# Patient Record
Sex: Female | Born: 1960 | Marital: Married
Health system: Northeastern US, Academic
[De-identification: ages and names within clinical notes are randomized; demographics above are authoritative.]

---

## 2016-11-30 ENCOUNTER — Ambulatory Visit

## 2016-12-05 ENCOUNTER — Ambulatory Visit: Admitting: Rheumatology

## 2016-12-05 NOTE — Progress Notes (Signed)
* * *        **  Margaret [name]    --- ---    89 Y old Female, DOB: 1960-09-16    [address] [place], [name] [address]    Home: [phone]    Provider: Vania Rea        * * *    Telephone Encounter    ---    Answered by   Darrell Jewel  Date: 12/05/2016         Time: 08:49 AM    Reason   schedule lyme visit    --- ---            Message                      request from [facility] via Janace Litten to get this patient into see Dr. Leonette Most for Lyme.  We are trying to put with dr. Clarene Duke while precepted by Dr. Leonette Most.  Pt has history of Lupus and Lyme.                Action Taken   Quigan,Michael 12/05/2016 8:50:21 AM > checked w/ dr Leonette Most, lvm  for pt to call me back so I could schedule w/ dr little precepted by dr  Leonette Most. Rodriguez,Gadira 12/05/2016 9:25:12 AM > spoke with patient she is all  set for 8/20 with Dr.Jill Ruppe Quigan,Michael 12/05/2016 9:50:12 AM > scheduled by  mercedes                * * *                ---          * * *          Patient: [name], Margaret S DOB: [birth year] Provider: Vania Rea 12/05/2016    ---    Note generated by eClinicalWorks EMR/PM Software ([email or web address])

## 2016-12-17 ENCOUNTER — Ambulatory Visit: Admitting: Rheumatology

## 2016-12-17 ENCOUNTER — Ambulatory Visit (HOSPITAL_BASED_OUTPATIENT_CLINIC_OR_DEPARTMENT_OTHER): Admitting: Psychiatry

## 2016-12-17 ENCOUNTER — Ambulatory Visit: Admit: 2016-12-17 | Payer: No Typology Code available for payment source

## 2016-12-17 LAB — HX HEM-ROUTINE
HX BASO #: 0 10*3/uL (ref 0.0–0.2)
HX BASO: 0 %
HX EOSIN #: 0.1 10*3/uL (ref 0.0–0.5)
HX EOSIN: 1 %
HX HCT: 42.4 % (ref 32.0–45.0)
HX HGB: 13.6 g/dL (ref 11.0–15.0)
HX IMMATURE GRANULOCYTE#: 0 10*3/uL (ref 0.0–0.1)
HX IMMATURE GRANULOCYTE: 0 %
HX LYMPH #: 2.4 10*3/uL (ref 1.0–4.0)
HX LYMPH: 33 %
HX MCH: 27.8 pg (ref 26.0–34.0)
HX MCHC: 32.1 g/dL (ref 32.0–36.0)
HX MCV: 86.5 fL (ref 80.0–98.0)
HX MONO #: 0.6 10*3/uL (ref 0.2–0.8)
HX MONO: 8 %
HX MPV: 11.6 fL (ref 9.1–11.7)
HX NEUT #: 4.2 10*3/uL (ref 1.5–7.5)
HX PLT: 223 10*3/uL (ref 150–400)
HX RBC BLOOD COUNT: 4.9 M/uL (ref 3.70–5.00)
HX RDW: 12.6 % (ref 11.5–14.5)
HX SEG NEUT: 57 %
HX WBC: 7.4 10*3/uL (ref 4.0–11.0)

## 2016-12-17 LAB — HX BF-URINALYSIS
HX KETONES: NEGATIVE mg/dL
HX LEUKOCYTE ES: NEGATIVE
HX NITRITE LEVEL: NEGATIVE
HX SPECIFIC GRAVITY: 1.018
HX U BILIRUBIN: NEGATIVE
HX U BLOOD: NEGATIVE
HX U GLUCOSE: NEGATIVE mg/dL
HX U PH: 7
HX U PROTEIN: NEGATIVE mg/dL
HX U UROBILINIG: 0.2 EU

## 2016-12-17 LAB — HX BF-CHEM/URINE
HX CREATININE, RANDOM URINE: 106.45 mg/dL
HX PROTEIN RANDOM, URINE (INCLUDES CREATININE): 85 mg/g (ref 0–200)
HX PROTEIN, RANDOM URINE(INCLUDES CREATININE): 9 mg/dL (ref 0–15)

## 2016-12-17 LAB — HX CHEM-PANELS
HX CREATININE (CR): 0.74 mg/dL (ref 0.57–1.30)
HX GFR, AFRICAN AMERICAN: 105 mL/min/{1.73_m2}
HX GFR, NON-AFRICAN AMERICAN: 91 mL/min/{1.73_m2}

## 2016-12-17 LAB — HX CHEM-LFT
HX ALANINE AMINOTRANSFERASE (ALT/SGPT): 23 IU/L (ref 0–54)
HX ALKALINE PHOSPHATASE (ALK): 79 IU/L (ref 40–130)
HX ASPARTATE AMINOTRANFERASE (AST/SGOT): 25 IU/L (ref 10–42)
HX BILIRUBIN, TOTAL: 0.6 mg/dL (ref 0.2–1.1)

## 2016-12-17 LAB — HX HEM-MISC: HX SED RATE: 24 mm (ref 0–30)

## 2016-12-17 NOTE — Progress Notes (Signed)
.  Progress Notes  .  Patient: Margaret [name], Margaret [name]  Provider: Vania Rea    .  DOB: [birth year] Age: 56 Y Sex: Female  .  PCP: Donia Pounds  MD  Date: 12/17/2016  .  --------------------------------------------------------------------------------  .  HISTORY OF PRESENT ILLNESS  .  GENERAL:  Margaret [name] is a 56 year old woman with no  signficant past medical history who presents today with multiple  symptoms. Her symptoms began 2-3 years ago. She reports bilateral  intermittent jaw pain, left more than right worse with chewing.  She has had multiple teeth extractions and sinus surgeries with  no resolution of her pain. She also reports recurrent headaches  with bilateral temple sharp shooting pain lasting a few seconds.  She has bilateral eye floaters that were assessed by an  ophthalmologist. She reports excessive fatigue, short term memory  deficits and "brain fog".She has bilateral hand pain and  stiffness with no temporal correlation, bilateral ankle, heel and  knee pain. No morning stiffness, no swelling. She saw her PCP who  ordered Lyme studies (ab was positive but Western blot was  negative, all bands were nonreactive). She was treated with  doxycyline for 1 month (6/18-7/18) with minimal improvement of  her symptoms. She also reported microscopic hematuria of 2 years  duration with negative cystoscopy.  .  CURRENT MEDICATIONS  .  Taking Fluoxetine  .  ALLERGIES  .  yes[Allergies Verified]  .  SURGICAL HISTORY  .  Hysterectomy for fibroids  .  FAMILY HISTORY  .  Mother: alive  Mother has OA.  .  SOCIAL HISTORY  .  Lives in [place] with husband, never smoker, occasional alcohol  use, wokrs as IT consultant.  .  REVIEW OF SYSTEMS  .  Rheum :  .  Constitutional:    No recent weight loss, fevers, or chills .  Eyes:    Mild eye dryness, rest as HPI . HENT:    No hair loss,  oral ulcers . Respiratory:    No shortness of breath, or cough .  CV    No chest pain, claudication symptoms, or extremity swelling  .  Gastrointestinal    No diarrhea, or bloody stool .  Genitourinary    No dysuria, or discharges . Musculoskeletal     See HPI.  .  .  PHYSICAL EXAMINATION  .  General Examination:  General Appearance  well developed, well nourished, alert,  oriented, in no acute distress. Eyes  sclera non-icteric. Oral  Cavity  mucosa moist. Neck/Thyroid  carotid pulse normal, no  carotid bruit, no jugular venous distension. Skin  no rashes.  Heart  regular rate and rhythm, normal S1, normal S2, faint II/VI  SEM over RUSB. Lungs  clear to auscultation bilaterally with no  wheezing, no crackles, no rhonchi. Abdomen  soft, non tender, non  distended, bowel sounds present. Extremities  Heberden nodes in  both thumbs, all other hands joints are within normal limits. No  swelling or tenderness. ROM intact in elbow, shoulder, hip and  knees. Tenderness over right post tibialis tendon, no other  abnormalites.  .  ASSESSMENTS  .  Pain, joint, multiple sites - M25.50 (Primary)  .  Attending Leonette Most) - I have reviewed full history and confirmed  key elements as well as examined her. We do not see clinical  evidence for Lyme and completely negative w blot indicates she  has not had this infection. Given pos ANA hematuria and array of  nonspecific  symptoms feel is reasonable to perform testing for  connective tissue disease. Her main concern is her cognitive  symptoms - discussed this is nonspecific and can have many  causes, did broach possibility of a fibromyalgia tendency as the  fatigue musculoskeletal pain out of propostion to physical  findings and cognitive symptoms all could fit though she does not  have the tender points. In addition feel many symptoms both  musculoskeletal and otherwise may be C/W common regional issues  and other processes common to her age, for example suspect early  hand OA, possible disc disease back, floaters in eyes etc.  .  TREATMENT  .  Pain, joint, multiple sites  LAB: Renal Function(80069)  .  LAB:  Protein/Creatinine, Random Urine  Protein, Random Urine (includes Creatinine)     9     (0 - 15 -  mg/dL)  Creatinine Random, Urine     106.45     ( - mg/dL)  Protein/Creatinine Ratio, Urine     85     (0 - 200 - mg/g)  .  Marland Kitchen  LAB: Sed Rate ESR (ESR)  Sed Rate     24     (0 - 30 - mm)  .  Marland Kitchen  LAB: Urinalysis UA (UA)  U KETONES     Negative     (Negative - mg/dL)  U Bilirubin     Negative     (Negative - )  U Glucose     Negative     (Negative - mg/dL)  U Color     Yellow     ( - )  U Appearance     Clear     ( - )  U pH     7     (5.0-8.0 - )  Specific Gravity     1.018     (1.001-1.035 - )  U Blood     Negative     (Negative - )  U Protein     Negative     (Negative - mg/dL)  U Urobilinig     0.2     (0.2-1.0 - EU)  Leukocyte Es     Negative     (Negative - )  Nitrite Level     Negative     (Negative - )  .  Marland Kitchen  LAB: Anti DS DNA (ADNA)  Anti DS DNA     2.1     (0.0 - 25.0 - EU)  .  Marland Kitchen  LAB: Anti Nuclear Ab Screen  .  LAB: Ext Nclr Ag (Ena) Smith  Ext Nclr Ag (Ena) Smith     1.1     (0.0 - 20.0 - EU)  .  Marland Kitchen  LAB: Ext Nclr Antg(Ena) RNP  Ext Nclr Antg(Ena) RNP     1.0     (0.0 - 20.0 - EU)  .  Marland Kitchen  LAB: Ext Nclr Antg(Ena) SSA  Ext Nclr Antg(Ena) SSA     1.5     (0.0 - 20.0 - EU)  .  Marland Kitchen  LAB: Ext Nclr Antg(Ena) SSB  Ext Nclr Antg(Ena) SSB     <1.0     (0.0 - 20.0 - EU)  .  Marland Kitchen  LAB: C-Reactive Protein, High sens (CRPHS)  .  LAB: CBC/DIFF with PLT (CBCWD)  WBC     7.4     (4.0 - 11.0 - K/uL)  RBC     4.90     (3.70 -  5.00 - M/uL)  HGB     13.6     (11.0 - 15.0 - g/dL)  HCT     09.8     (11.9 - 45.0 - %)  MCV     86.5     (80.0 - 98.0 - fL)  MCH     27.8     (26.0 - 34.0 - pg)  MCHC     32.1     (32.0 - 36.0 - g/dL)  RDW     14.7     (82.9 - 14.5 - %)  PLT     223     (150 - 400 - K/uL)  MPV     11.6     (9.1 - 11.7 - fL)  SEG NEUT     57     ( - %)  LYMPH     33     ( - %)  MONO     8     ( - %)  EOS     1     ( - %)  BASO     0     ( - %)  NEUT #     4.2     (1.5 - 7.5 - K/uL)  LYMPH #     2.4     (1.0 - 4.0 - K/uL)  MONO #      0.6     (0.2 - 0.8 - K/uL)  EOSIN #     0.1     (0.0 - 0.5 - K/uL)  BASO #     0.0     (0.0 - 0.2 - K/uL)  Imm Grnas     0     ( - %)  Imm Grans, Abs     0.0     (0.0 - 0.1 - K/uL)  .  Marland Kitchen  LAB: Creatinine (CR)  Creatinine (CR)     0.74     (0.57 - 1.30 - mg/dL)  .  FOLLOW UP  .  prn  .  Electronically signed by Vania Rea , MD on  12/21/2016 at 05:52 PM EDT  .  ADDENDUM:  12/21/2016 05:54 PM KALISH, ROBERT > ANA 1:640 but with all other lab tests here negative do not feel this is enough to indicate lupus or connective tissue disease. Discussed with her and advised she discuss with Dr Evalyn Casco at her next visit in September. Only if there are new indications of signs or symptoms to suggest lupus would I recommend retesting of ANA and other serologies. Sjogren's is a slight possibility with the ANA and some symptoms of dryness even with negative SSA and SSB but even if it is that do not now see systemic features that would indicate the need for more than local measures and symptomatic treatment  .  Document electronically signed by Vania Rea    .

## 2016-12-17 NOTE — Progress Notes (Signed)
* * *        **Margaret [name]**    --- ---    5 Y old Female, DOB: [birth year], External MRN: [id number]    Account Number: [id number]    [address], [place], [place]    Home: [phone]    Insurance: [employer] CIP Payer ID: PAPER    PCP: Donia Pounds, MD Referring: Donia Pounds, MD External Visit ID:  [id number]    Appointment Facility: [facility]ology, All[facility]ology        * * *    12/17/2016  Vania Rea, MD **CHN#:** [id number]    --- ---    ---        Current Medications    ---    Taking     * Fluoxetine     ---      Surgical History    ---      Hysterectomy for fibroids    ---      Family History    ---      Mother: alive    ---    Mother has OA.    ---      Social History    ---      Lives in [place] with husband, never smoker, occasional alcohol use, wokrs  as IT consultant.    ---      Review of Systems    ---     _Rheum_ :    Constitutional: No recent weight loss, fevers, or chills. Eyes: Mild eye  dryness, rest as HPI. HENT: No hair loss, oral ulcers. Respiratory: No  shortness of breath, or cough. CV No chest pain, claudication symptoms, or  extremity swelling. Gastrointestinal No diarrhea, or bloody stool.  Genitourinary No dysuria, or discharges. Musculoskeletal See HPI. Marland Kitchen            History of Present Illness    ---     _GENERAL_ :    Margaret [name] is a 56 year old woman with no signficant past medical history who  presents today with multiple symptoms.    Her symptoms began 2-3 years ago. She reports bilateral intermittent jaw pain,  left more than right worse with chewing. She has had multiple teeth  extractions and sinus surgeries with no resolution of her pain. She also  reports recurrent headaches with bilateral temple sharp shooting pain lasting  a few seconds. She has bilateral eye floaters that were assessed by an  ophthalmologist.    She reports excessive fatigue, short term memory deficits and "brain fog".    She has bilateral hand pain and stiffness with no temporal  correlation,  bilateral ankle, heel and knee pain. No morning stiffness, no swelling.    She saw her PCP who ordered Lyme studies (ab was positive but Western blot was  negative, all bands were nonreactive). She was treated with doxycyline for 1  month (6/18-7/18) with minimal improvement of her symptoms.    She also reported microscopic hematuria of 2 years duration with negative  cystoscopy.      Physical Examination    ---     _General Examination_ :    General Appearance well developed, well nourished, alert, oriented, in no  acute distress. Eyes sclera non-icteric. Oral Cavity mucosa moist.  Neck/Thyroid carotid pulse normal, no carotid bruit, no jugular venous  distension. Skin no rashes. Heart regular rate and rhythm, normal S1, normal  S2, faint II/VI SEM over RUSB. Lungs clear to auscultation bilaterally with no  wheezing, no crackles, no rhonchi. Abdomen soft, non tender, non distended,  bowel sounds present. Extremities Heberden nodes in both thumbs, all other  hands joints are within normal limits. No swelling or tenderness. ROM intact  in elbow, shoulder, hip and knees. Tenderness over right post tibialis tendon,  no other abnormalites.          Assessments    ---    1\. Pain, joint, multiple sites - M25.50 (Primary)    ---      Attending Leonette Most) - I have reviewed full history and confirmed key elements  as well as examined her. We do not see clinical evidence for Lyme and  completely negative w blot indicates she has not had this infection. Given pos  ANA hematuria and array of nonspecific symptoms feel is reasonable to perform  testing for connective tissue disease. Her main concern is her cognitive  symptoms - discussed this is nonspecific and can have many causes, did broach  possibility of a fibromyalgia tendency as the fatigue musculoskeletal pain out  of propostion to physical findings and cognitive symptoms all could fit though  she does not have the tender points. In addition feel many  symptoms both  musculoskeletal and otherwise may be C/W common regional issues and other  processes common to her age, for example suspect early hand OA, possible disc  disease back, floaters in eyes etc.    ---      Treatment    ---       **1\. Pain, joint, multiple sites**    _LAB: Renal Function(80069)_    _LAB: Protein/Creatinine, Random Urine_   Protein, Random Urine (includes  Creatinine)  9    0 - 15 - mg/dL    --- --- --- ---    Creatinine Random, Urine  106.45     \- mg/dL    --- --- --- ---    Protein/Creatinine Ratio, Urine  85    0 - 200 - mg/g    --- --- --- ---    _LAB: Sed Rate ESR (ESR)_ Sed Rate  24    0 - 30 - mm    --- --- --- ---    _LAB: Urinalysis UA (UA)_ U KETONES  Negative    Negative - mg/dL    --- --- --- ---    U Bilirubin  Negative    Negative -    --- --- --- ---    U Glucose  Negative    Negative - mg/dL    --- --- --- ---    U Color  Yellow     \-    --- --- --- ---    U Appearance  Clear     \-    --- --- --- ---    U pH  7    5.0-8.0 -    --- --- --- ---    Specific Gravity  1.018    1.001-1.035 -    --- --- --- ---    U Blood  Negative    Negative -    --- --- --- ---    U Protein  Negative    Negative - mg/dL    --- --- --- ---    U Urobilinig  0.2    0.2-1.0 - EU    --- --- --- ---    Leukocyte Es  Negative    Negative -    --- --- --- ---    Nitrite Level  Negative  Negative -    --- --- --- ---    _LAB: Anti DS DNA (ADNA)_ Anti DS DNA  2.1    0.0 - 25.0 - EU    --- --- --- ---    _LAB: Anti Nuclear Ab Screen_    _LAB: Ext Nclr Ag (Ena) Smith_ Ext Nclr Ag (Ena) Smith  1.1    0.0 - 20.0 - EU    --- --- --- ---    _LAB: Ext Nclr Antg(Ena) RNP_ Ext Nclr Antg(Ena) RNP  1.0    0.0 - 20.0 - EU    --- --- --- ---    _LAB: Ext Nclr Antg(Ena) SSA_ Ext Nclr Antg(Ena) SSA  1.5    0.0 - 20.0 - EU    --- --- --- ---    _LAB: Ext Nclr Antg(Ena) SSB_ Ext Nclr Antg(Ena) SSB  <1.0    0.0 - 20.0 - EU    --- --- --- ---    _LAB: C-Reactive Protein, High sens (CRPHS)_    _LAB: CBC/DIFF with PLT  (CBCWD)_ WBC  7.4    4.0 - 11.0 - K/uL    --- --- --- ---    RBC  4.90    3.70 - 5.00 - M/uL    --- --- --- ---    HGB  13.6    11.0 - 15.0 - g/dL    --- --- --- ---    HCT  42.4    32.0 - 45.0 - %    --- --- --- ---    MCV  86.5    80.0 - 98.0 - fL    --- --- --- ---    MCH  27.8    26.0 - 34.0 - pg    --- --- --- ---    MCHC  32.1    32.0 - 36.0 - g/dL    --- --- --- ---    RDW  12.6    11.5 - 14.5 - %    --- --- --- ---    PLT  223    150 - 400 - K/uL    --- --- --- ---    MPV  11.6    9.1 - 11.7 - fL    --- --- --- ---    SEG NEUT  57     \- %    --- --- --- ---    LYMPH  33     \- %    --- --- --- ---    MONO  8     \- %    --- --- --- ---    EOS  1     \- %    --- --- --- ---    BASO  0     \- %    --- --- --- ---    NEUT #  4.2    1.5 - 7.5 - K/uL    --- --- --- ---    LYMPH #  2.4    1.0 - 4.0 - K/uL    --- --- --- ---    MONO #  0.6    0.2 - 0.8 - K/uL    --- --- --- ---    EOSIN #  0.1    0.0 - 0.5 - K/uL    --- --- --- ---    BASO #  0.0    0.0 - 0.2 - K/uL    --- --- --- ---    Imm Adria Dill  0     \- %    --- --- --- ---    Imm Grans, Abs  0.0    0.0 - 0.1 - K/uL    --- --- --- ---    _LAB: Creatinine (CR)_ Creatinine (CR)  0.74    0.57 - 1.30 - mg/dL    --- --- --- ---      Follow Up    ---    prn    Electronically signed by Vania Rea , MD on 12/21/2016 at 05:52 PM EDT    Sign off status: Completed        * * *        Rheumatology, Allergy and Immunology    [address]    [place], [name] [address]    Tel: [phone]    Fax: [phone]              * * *          Patient: Margaret [name], Margaret [name] DOB: Jul 28, 1960 Progress Note: Vania Rea, MD  12/17/2016    ---    Note generated by eClinicalWorks EMR/PM Software ([email or web address])

## 2016-12-21 ENCOUNTER — Ambulatory Visit: Admitting: Rheumatology

## 2016-12-21 LAB — HX IMMUNOLOGY
HX ANA TITER: 1:640 {titer} — AB
HX ANTI DS DNA: 2.1 EU (ref 0.0–25.0)
HX ANTI NUCLEAR ANTIBODY SCREEN: REACTIVE — AB
HX C-REACTIVE PROTEIN (HIGH SENSITIVITY): 1.98 mg/L (ref 0.00–7.48)
HX EXT NCLR AG (ENA) SMITH: 1.1 EU (ref 0.0–20.0)
HX EXT NCLR ANTG(ENA) RNP: 1 EU (ref 0.0–20.0)
HX EXT NCLR ANTG(ENA) SSA: 1.5 EU (ref 0.0–20.0)
HX EXT NCLR ANTG(ENA) SSB: <1 EU (ref 0.0–20.0)

## 2016-12-21 NOTE — Progress Notes (Signed)
* * *        **  Margaret [name]    --- ---    64 Y old Female, DOB: [birth year]    [address] [place], [name] [address]    Home: [phone]    Provider: Vania Rea        * * *    Telephone Encounter    ---    Answered by   Elvina Sidle A  Date: 12/21/2016         Time: 02:38 PM    Reason   Labs results    --- ---            Message                      Patient calling for lab results. Call back number on file.                Action Taken   KUMAR,SONAM A 12/21/2016 2:38:23 PM > Reviewed labs with her see  addendum to note Annell Canty 12/21/2016 5:55:00 PM >                * * *                ---          * * *          Patient: [name], Margaret S DOB: 12-09-1960 Provider: Vania Rea 12/21/2016    ---    Note generated by eClinicalWorks EMR/PM Software ([email or web address])

## 2019-09-10 ENCOUNTER — Ambulatory Visit: Admitting: Infectious Disease

## 2019-09-10 ENCOUNTER — Ambulatory Visit: Admit: 2019-09-10 | Payer: No Typology Code available for payment source

## 2019-10-01 ENCOUNTER — Ambulatory Visit: Admitting: Infectious Disease

## 2019-10-01 ENCOUNTER — Ambulatory Visit: Admit: 2019-10-01 | Payer: No Typology Code available for payment source

## 2019-10-13 IMAGING — MR RM - COLUNA LOMBAR
4 of 5 series · 19 of 48 positions shown · non-contrast
Comparison: none

[Series 3: T2 · sagittal · 4.0mm · 0.61mm/px · 9 of 13 slices shown (1 of 2)]
[im 1/13]
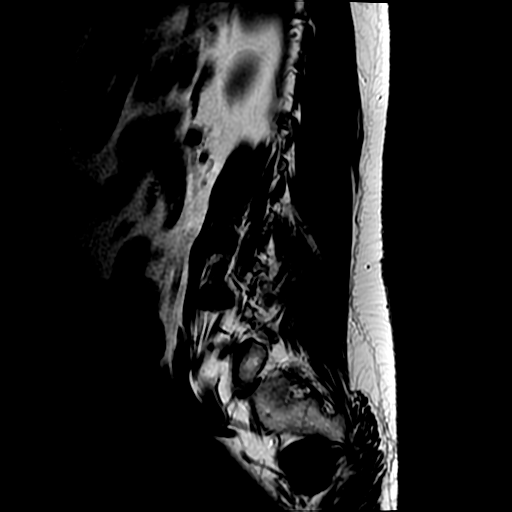
[im 2/13]
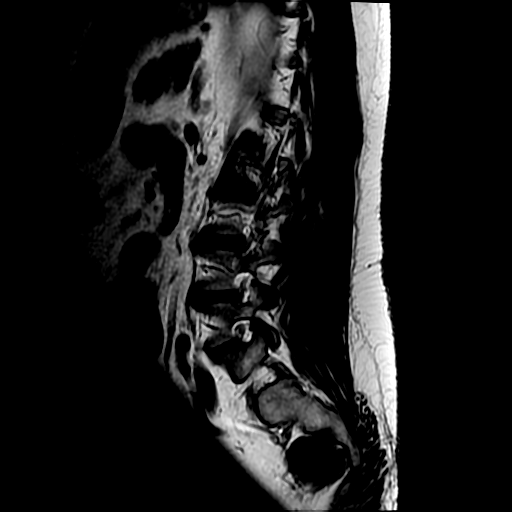
[im 4/13]
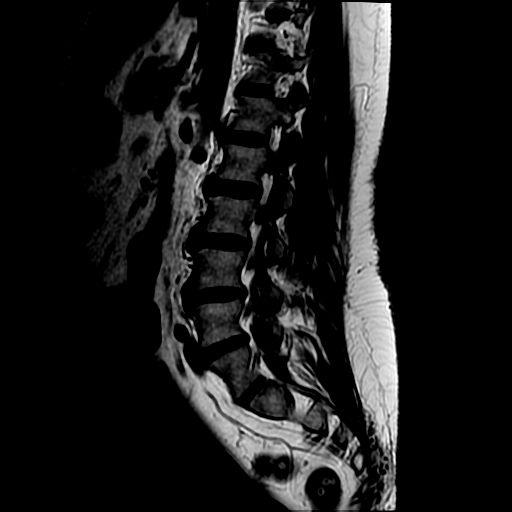
[im 5/13]
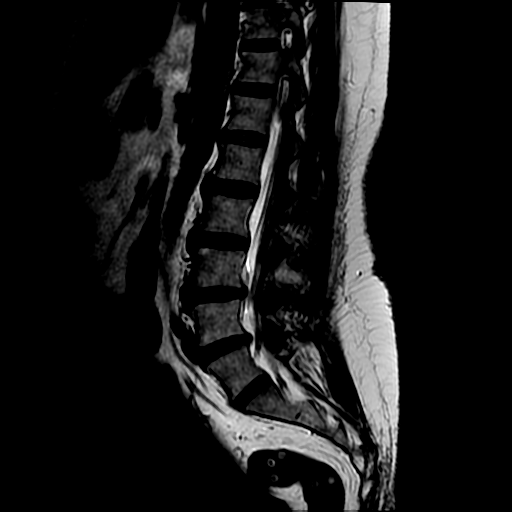
[im 7/13]
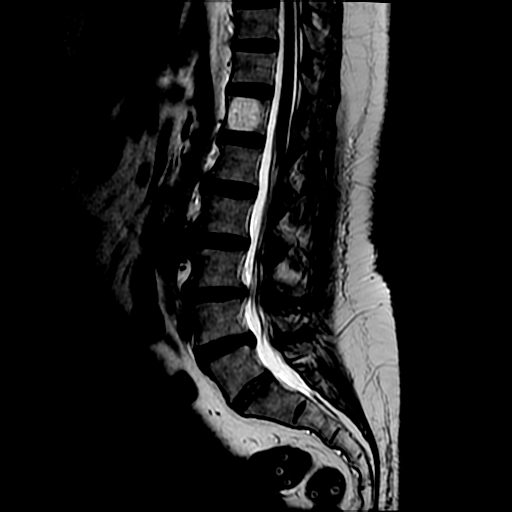
[im 8/13]
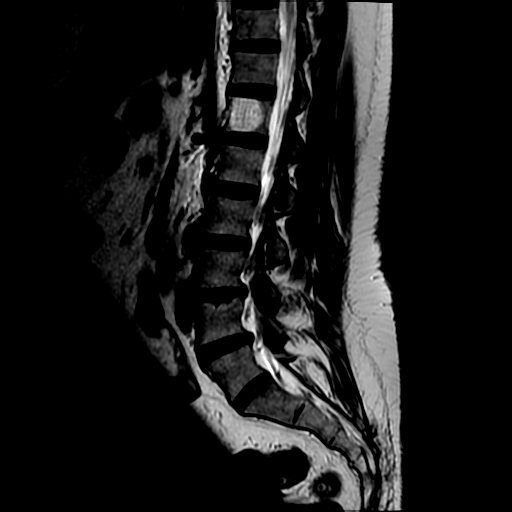
[im 10/13]
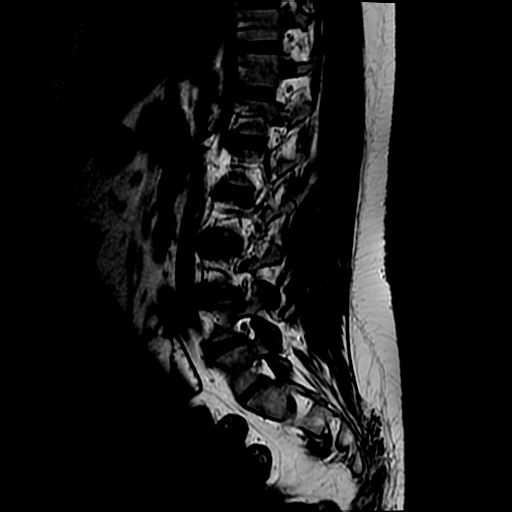
[im 11/13]
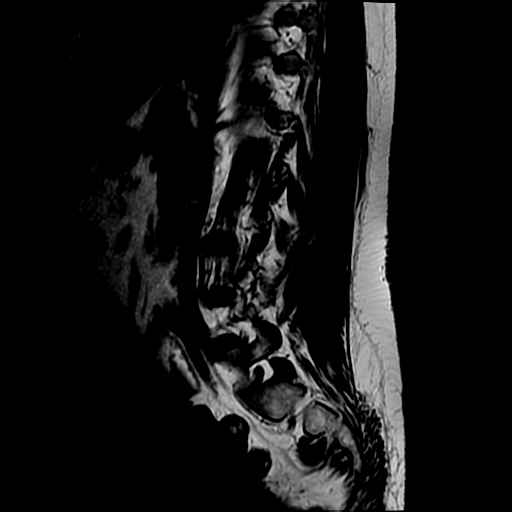
[im 13/13]
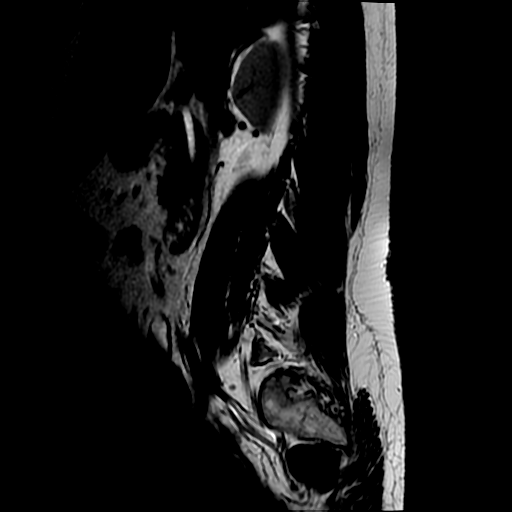

[Series 4: T1 · sagittal · 4.0mm · 0.61mm/px · 3 of 13 slices shown]
[im 2/13]
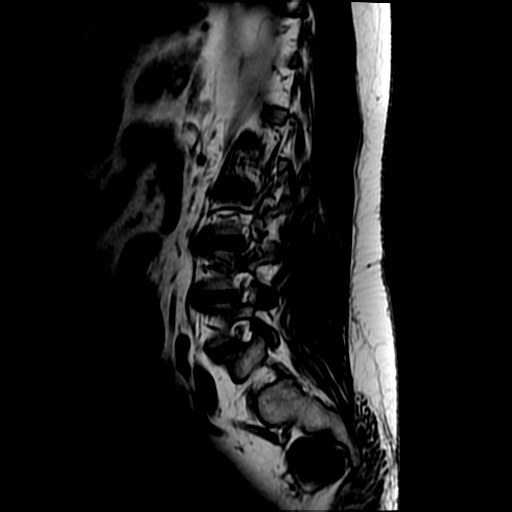
[im 7/13]
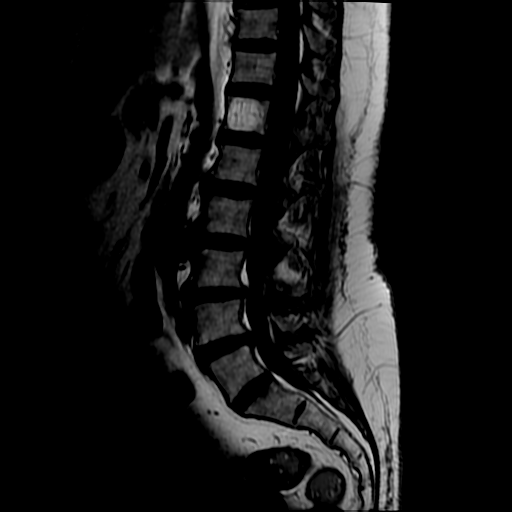
[im 11/13]
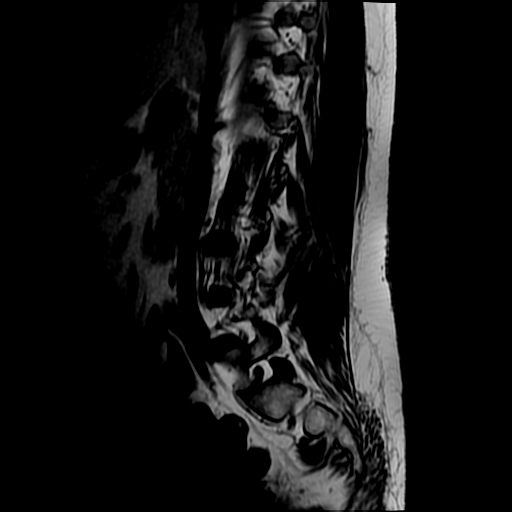

[Series 5: STIR · sagittal · 4.0mm · 0.61mm/px · 3 of 13 slices shown]
[im 2/13]
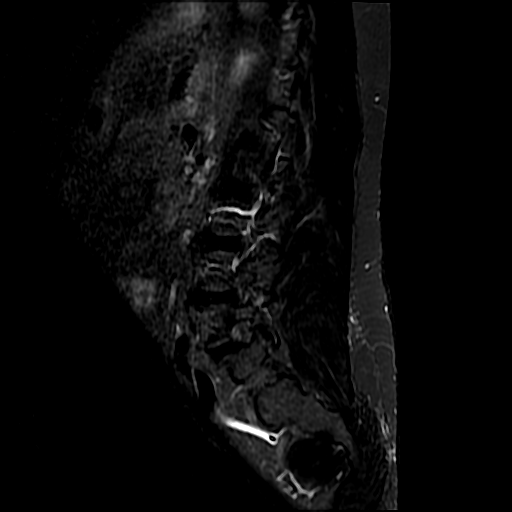
[im 7/13]
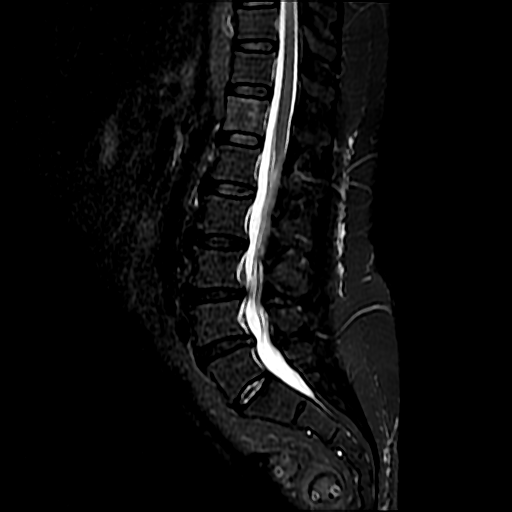
[im 11/13]
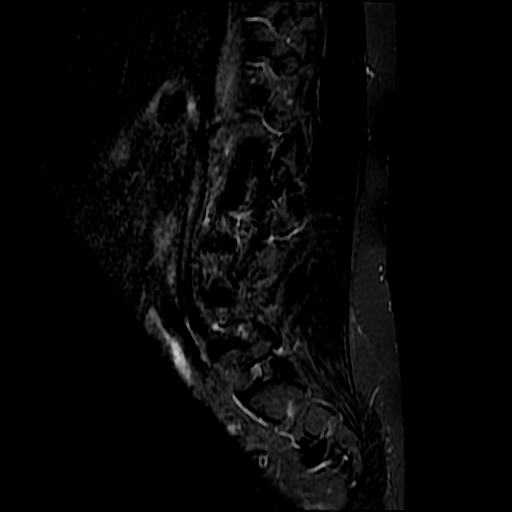

[Series 6: T2 · axial · 4.0mm · 0.39mm/px · z∈[-141,+16]mm · 4 of 24 slices shown (2 of 2)]
[im 1/24]
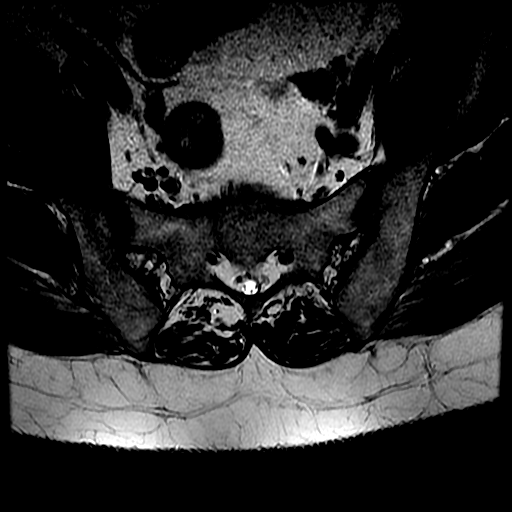
[im 4/24]
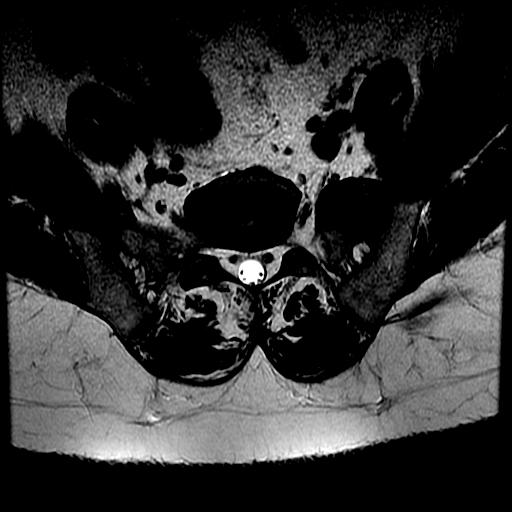
[im 12/24]
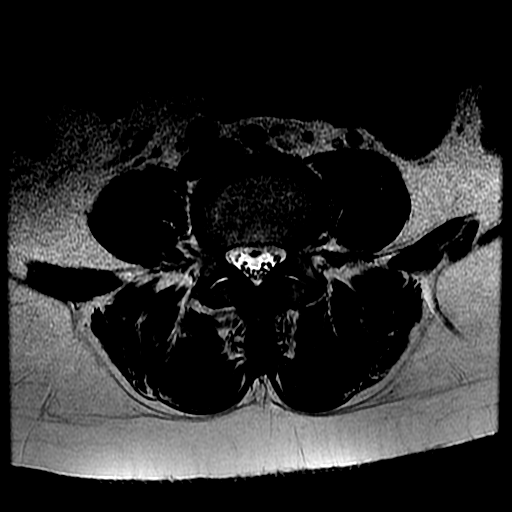
[im 20/24]
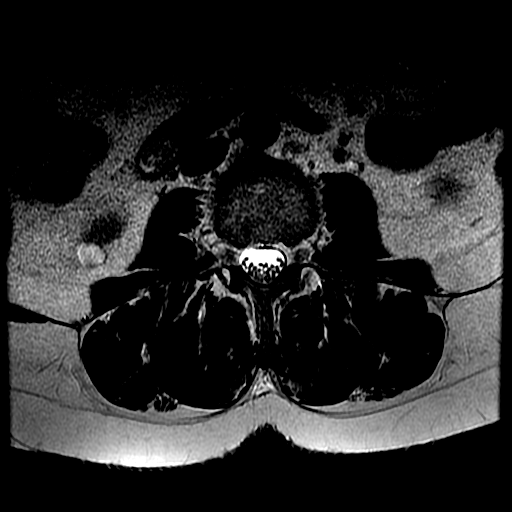

[19 of 48 positions shown; findings below may reference images not displayed]

TÉCNICA:
Realizadas sequências FSE e STIR, ponderadas em T1 e T2, em aquisições multiplanares, sem contraste.

ANÁLISE:
RESSONÂNCIA MAGNÉTICA DA COLUNA LOMBOSSACRA 
Bom alinhamento dos corpos vertebrais examinados em decúbito. 
Lombarização de S1, com disco rudimentar interposto em S1-S2.
Osteófitos marginais nos corpos vertebrais lombares.
Alteração degenerativa Modic tipo I / II nos planaltos vertebrais L4 e L5.
Imagem sugestiva de hemangioma no corpo vertebral L1.
Alterações degenerativas nas articulações interapofisárias L5-S1.
Desidratação dos discos intervertebrais lombares.
Nível L3-L4: discreto abaulamento simétrico, sem compressão radicular.
Nível L4-L5: abaulamento assimétrico posterior centrolateral direito que comprime as raízes descendentes 
neste nível, sobretudo a direita.
Nível L5-S1: protrusão posterior centrolateral direita que toca a raiz descendente direita.
Canal vertebral e forames intervertebrais com amplitudes preservadas.
Cone medular com topografia e sinal normais.
Saco dural e raízes nervosas intra e extradurais com trajeto e sinal conservados.
Partes moles paravertebrais com aspecto normal.
  Se  você  não  for  destinatário,  saiba  que  qualquer  divulgação,  cópia,  distribuição  ou  utilização  do  conteúdo  dessas 
informações é proibido e passível de punição dentro da lei.
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
## 2020-07-26 NOTE — Progress Notes (Signed)
* * *        **  Margaret [name]    --- ---    89 Y old Female, DOB: 1960-09-16    [address] [place], [name] [address]    Home: [phone]    Provider: Vania Rea        * * *    Telephone Encounter    ---    Answered by   Darrell Jewel  Date: 12/05/2016         Time: 08:49 AM    Reason   schedule lyme visit    --- ---            Message                      request from [facility] via Janace Litten to get this patient into see Dr. Leonette Most for Lyme.  We are trying to put with dr. Clarene Duke while precepted by Dr. Leonette Most.  Pt has history of Lupus and Lyme.                Action Taken   Quigan,Michael 12/05/2016 8:50:21 AM > checked w/ dr Leonette Most, lvm  for pt to call me back so I could schedule w/ dr little precepted by dr  Leonette Most. Rodriguez,Gadira 12/05/2016 9:25:12 AM > spoke with patient she is all  set for 8/20 with Dr.Jill Ruppe Quigan,Michael 12/05/2016 9:50:12 AM > scheduled by  mercedes                * * *                ---          * * *          Patient: [name], Margaret S DOB: [birth year] Provider: Vania Rea 12/05/2016    ---    Note generated by eClinicalWorks EMR/PM Software ([email or web address])

## 2020-07-26 NOTE — Progress Notes (Signed)
* * *        **  Margaret [name]    --- ---    64 Y old Female, DOB: [birth year]    [address] [place], [name] [address]    Home: [phone]    Provider: Vania Rea        * * *    Telephone Encounter    ---    Answered by   Elvina Sidle A  Date: 12/21/2016         Time: 02:38 PM    Reason   Labs results    --- ---            Message                      Patient calling for lab results. Call back number on file.                Action Taken   KUMAR,SONAM A 12/21/2016 2:38:23 PM > Reviewed labs with her see  addendum to note Annell Canty 12/21/2016 5:55:00 PM >                * * *                ---          * * *          Patient: [name], Margaret S DOB: 12-09-1960 Provider: Vania Rea 12/21/2016    ---    Note generated by eClinicalWorks EMR/PM Software ([email or web address])

## 2020-07-26 NOTE — Progress Notes (Signed)
* * *        **Tomasa Rand**    --- ---    60 Y old Female, DOB: [birth year], External MRN: [id number]    Account Number: [id number]    [address], [place], [place]    Home: [phone]    Insurance: [employer] CIP Payer ID: PAPER    PCP: Donia Pounds, MD Referring: Donia Pounds, MD External Visit ID:  [id number]    Appointment Facility: [facility]ology[facility] Immunology        * * *    12/17/2016  Vania Rea, MD **CHN#:** [id number]    --- ---    ---        Current Medications    ---    Taking     * Fluoxetine     ---      Surgical History    ---      Hysterectomy for fibroids    ---      Family History    ---      Mother: alive    ---    Mother has OA.    ---      Social History    ---      Lives in [place] with husband, never smoker, occasional alcohol use, wokrs  as IT consultant.    ---      Review of Systems    ---     _Rheum_ :    Constitutional: No recent weight loss, fevers, or chills. Eyes: Mild eye  dryness, rest as HPI. HENT: No hair loss, oral ulcers. Respiratory: No  shortness of breath, or cough. CV No chest pain, claudication symptoms, or  extremity swelling. Gastrointestinal No diarrhea, or bloody stool.  Genitourinary No dysuria, or discharges. Musculoskeletal See HPI. Marland Kitchen            History of Present Illness    ---     _GENERAL_ :    Ms. Elbert is a 60 year old woman with no signficant past medical history who  presents today with multiple symptoms.    Her symptoms began 2-3 years ago. She reports bilateral intermittent jaw pain,  left more than right worse with chewing. She has had multiple teeth  extractions and sinus surgeries with no resolution of her pain. She also  reports recurrent headaches with bilateral temple sharp shooting pain lasting  a few seconds. She has bilateral eye floaters that were assessed by an  ophthalmologist.    She reports excessive fatigue, short term memory deficits and "brain fog".    She has bilateral hand pain and stiffness with no temporal  correlation,  bilateral ankle, heel and knee pain. No morning stiffness, no swelling.    She saw her PCP who ordered Lyme studies (ab was positive but Western blot was  negative, all bands were nonreactive). She was treated with doxycyline for 1  month (6/18-7/18) with minimal improvement of her symptoms.    She also reported microscopic hematuria of 2 years duration with negative  cystoscopy.      Physical Examination    ---     _General Examination_ :    General Appearance well developed, well nourished, alert, oriented, in no  acute distress. Eyes sclera non-icteric. Oral Cavity mucosa moist.  Neck/Thyroid carotid pulse normal, no carotid bruit, no jugular venous  distension. Skin no rashes. Heart regular rate and rhythm, normal S1, normal  S2, faint II/VI SEM over RUSB. Lungs clear to auscultation bilaterally with no  wheezing, no crackles, no rhonchi. Abdomen soft, non tender, non distended,  bowel sounds present. Extremities Heberden nodes in both thumbs, all other  hands joints are within normal limits. No swelling or tenderness. ROM intact  in elbow, shoulder, hip and knees. Tenderness over right post tibialis tendon,  no other abnormalites.          Assessments    ---    1\. Pain, joint, multiple sites - M25.50 (Primary)    ---      Attending Leonette Most) - I have reviewed full history and confirmed key elements  as well as examined her. We do not see clinical evidence for Lyme and  completely negative w blot indicates she has not had this infection. Given pos  ANA hematuria and array of nonspecific symptoms feel is reasonable to perform  testing for connective tissue disease. Her main concern is her cognitive  symptoms - discussed this is nonspecific and can have many causes, did broach  possibility of a fibromyalgia tendency as the fatigue musculoskeletal pain out  of propostion to physical findings and cognitive symptoms all could fit though  she does not have the tender points. In addition feel many  symptoms both  musculoskeletal and otherwise may be C/W common regional issues and other  processes common to her age, for example suspect early hand OA, possible disc  disease back, floaters in eyes etc.    ---      Treatment    ---       **1\. Pain, joint, multiple sites**    _LAB: Renal Function(80069)_    _LAB: Protein/Creatinine, Random Urine_   Protein, Random Urine (includes  Creatinine)  9    0 - 15 - mg/dL    --- --- --- ---    Creatinine Random, Urine  106.45     \- mg/dL    --- --- --- ---    Protein/Creatinine Ratio, Urine  85    0 - 200 - mg/g    --- --- --- ---    _LAB: Sed Rate ESR (ESR)_ Sed Rate  24    0 - 30 - mm    --- --- --- ---    _LAB: Urinalysis UA (UA)_ U KETONES  Negative    Negative - mg/dL    --- --- --- ---    U Bilirubin  Negative    Negative -    --- --- --- ---    U Glucose  Negative    Negative - mg/dL    --- --- --- ---    U Color  Yellow     \-    --- --- --- ---    U Appearance  Clear     \-    --- --- --- ---    U pH  7    5.0-8.0 -    --- --- --- ---    Specific Gravity  1.018    1.001-1.035 -    --- --- --- ---    U Blood  Negative    Negative -    --- --- --- ---    U Protein  Negative    Negative - mg/dL    --- --- --- ---    U Urobilinig  0.2    0.2-1.0 - EU    --- --- --- ---    Leukocyte Es  Negative    Negative -    --- --- --- ---    Nitrite Level  Negative  Negative -    --- --- --- ---    _LAB: Anti DS DNA (ADNA)_ Anti DS DNA  2.1    0.0 - 25.0 - EU    --- --- --- ---    _LAB: Anti Nuclear Ab Screen_    _LAB: Ext Nclr Ag (Ena) Smith_ Ext Nclr Ag (Ena) Smith  1.1    0.0 - 20.0 - EU    --- --- --- ---    _LAB: Ext Nclr Antg(Ena) RNP_ Ext Nclr Antg(Ena) RNP  1.0    0.0 - 20.0 - EU    --- --- --- ---    _LAB: Ext Nclr Antg(Ena) SSA_ Ext Nclr Antg(Ena) SSA  1.5    0.0 - 20.0 - EU    --- --- --- ---    _LAB: Ext Nclr Antg(Ena) SSB_ Ext Nclr Antg(Ena) SSB  <1.0    0.0 - 20.0 - EU    --- --- --- ---    _LAB: C-Reactive Protein, High sens (CRPHS)_    _LAB: CBC/DIFF with PLT  (CBCWD)_ WBC  7.4    4.0 - 11.0 - K/uL    --- --- --- ---    RBC  4.90    3.70 - 5.00 - M/uL    --- --- --- ---    HGB  13.6    11.0 - 15.0 - g/dL    --- --- --- ---    HCT  42.4    32.0 - 45.0 - %    --- --- --- ---    MCV  86.5    80.0 - 98.0 - fL    --- --- --- ---    MCH  27.8    26.0 - 34.0 - pg    --- --- --- ---    MCHC  32.1    32.0 - 36.0 - g/dL    --- --- --- ---    RDW  12.6    11.5 - 14.5 - %    --- --- --- ---    PLT  223    150 - 400 - K/uL    --- --- --- ---    MPV  11.6    9.1 - 11.7 - fL    --- --- --- ---    SEG NEUT  57     \- %    --- --- --- ---    LYMPH  33     \- %    --- --- --- ---    MONO  8     \- %    --- --- --- ---    EOS  1     \- %    --- --- --- ---    BASO  0     \- %    --- --- --- ---    NEUT #  4.2    1.5 - 7.5 - K/uL    --- --- --- ---    LYMPH #  2.4    1.0 - 4.0 - K/uL    --- --- --- ---    MONO #  0.6    0.2 - 0.8 - K/uL    --- --- --- ---    EOSIN #  0.1    0.0 - 0.5 - K/uL    --- --- --- ---    BASO #  0.0    0.0 - 0.2 - K/uL    --- --- --- ---    Imm Adria Dill  0     \- %    --- --- --- ---    Imm Grans, Abs  0.0    0.0 - 0.1 - K/uL    --- --- --- ---    _LAB: Creatinine (CR)_ Creatinine (CR)  0.74    0.57 - 1.30 - mg/dL    --- --- --- ---      Follow Up    ---    prn    Electronically signed by Vania Rea , MD on 12/21/2016 at 05:52 PM EDT    Sign off status: Completed        * * *        Rheumatology, Allergy [facility] Immunology    [address]    [place], [name] [address]    Tel: [phone]    Fax: [phone]              * * *          Patient: LAVENDER, STANKE DOB: Jul 28, 1960 Progress Note: Vania Rea, MD  12/17/2016    ---    Note generated by eClinicalWorks EMR/PM Software ([email or web address])

## 2020-09-06 IMAGING — MR RM - CRANIO (ENCEFALO)
10 of 14 series · 18 of 48 positions shown · non-contrast
Comparison: none

[Series 3: FLAIR · sagittal · 5.0mm · 0.49mm/px · 2 of 24 slices shown (1 of 2)]
[im 1/24]
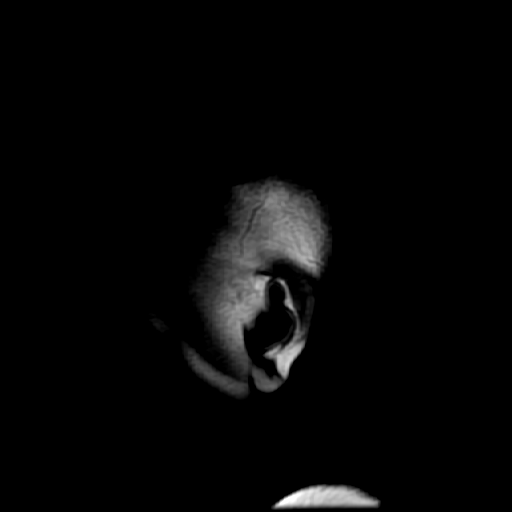
[im 24/24]
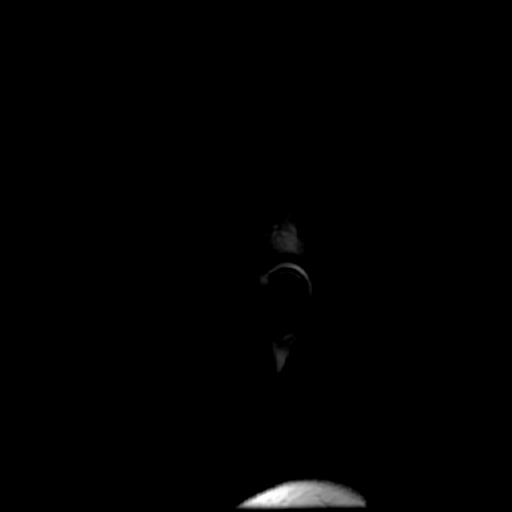

[Series 4: FLAIR · axial · 5.0mm · 0.47mm/px · 1 of 24 slices shown (2 of 2)]
[im 1/24]
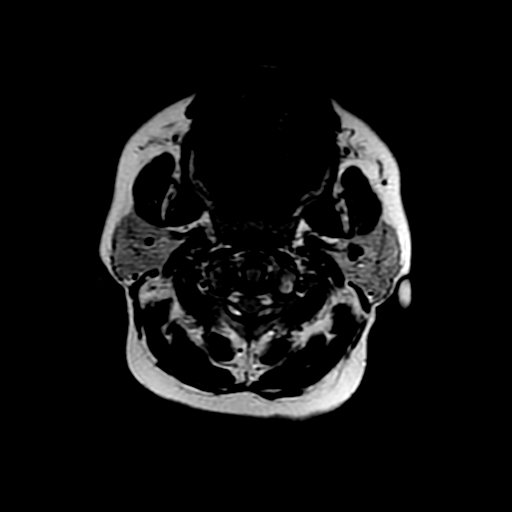

[Series 5: T2 · axial · 5.0mm · 0.47mm/px · 1 of 24 slices shown (1 of 2)]
[im 1/24]
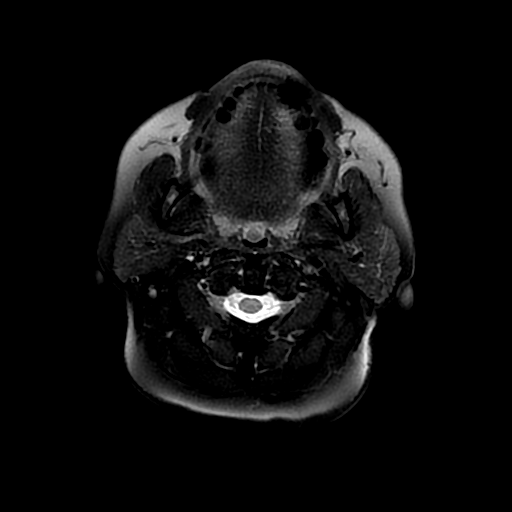

[Series 6: ax difusao · axial · 5.0mm · 0.94mm/px · z∈[-67,+82]mm · 2 of 48 slices shown]
[im 1/48]
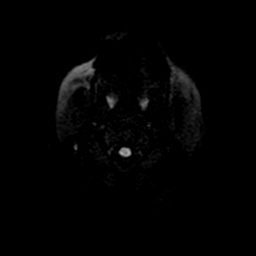
[im 48/48]
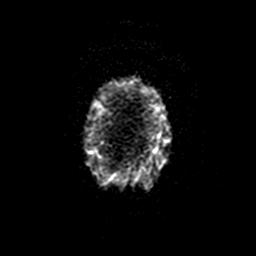

[Series 7: T2 · coronal · 4.5mm · 0.47mm/px · 1 of 24 slices shown (2 of 2)]
[im 1/24]
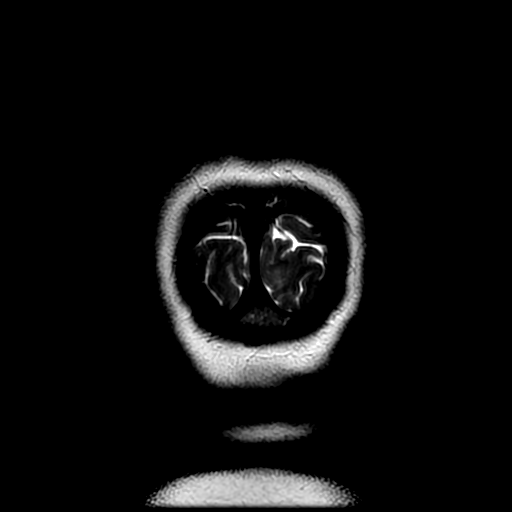

[Series 650: ADC · axial · 5.0mm · 0.94mm/px · 1 of 24 slices shown]
[im 1/24]
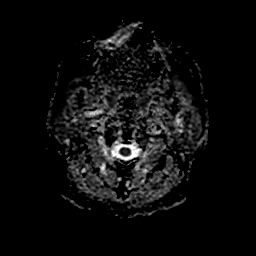

[Series 800: ax ref · axial · 1.4mm · 0.50mm/px · z∈[-82,+115]mm · 2 of 34 slices shown]
[im 1/34]
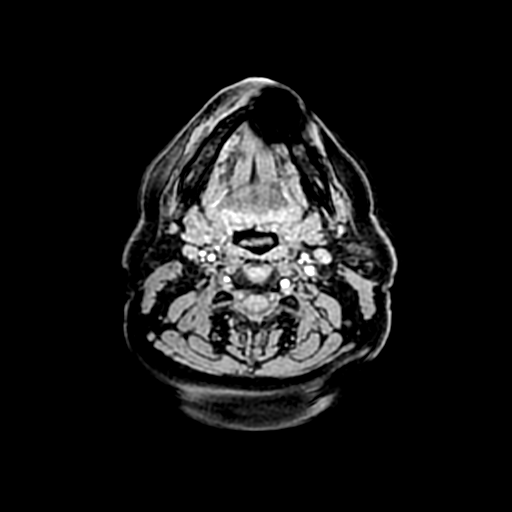
[im 34/34]
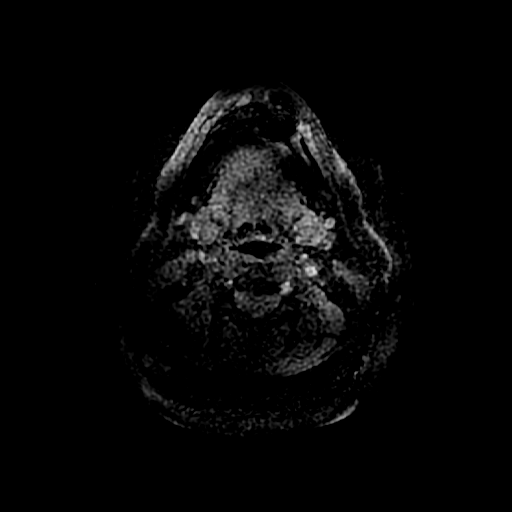

[Series 801: sag ref · sagittal · 1.1mm · 0.50mm/px · 2 of 40 slices shown]
[im 1/40]
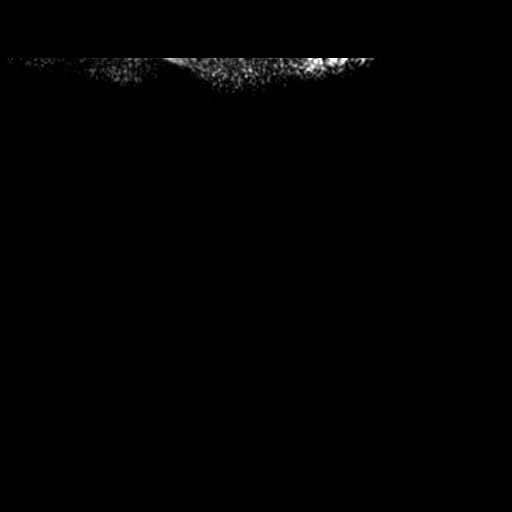
[im 40/40]
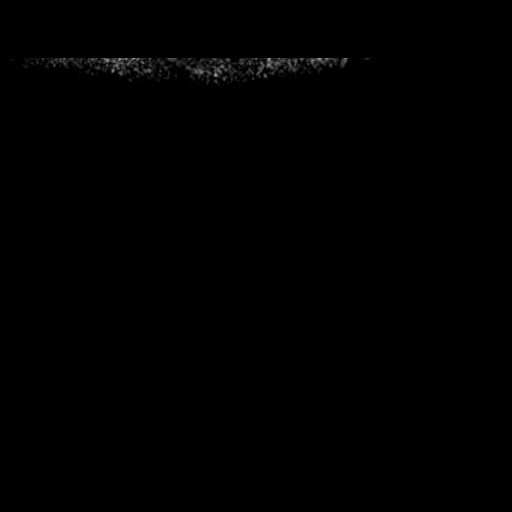

[Series 802: cor ref · coronal · 1.1mm · 0.50mm/px · 2 of 42 slices shown]
[im 1/42]
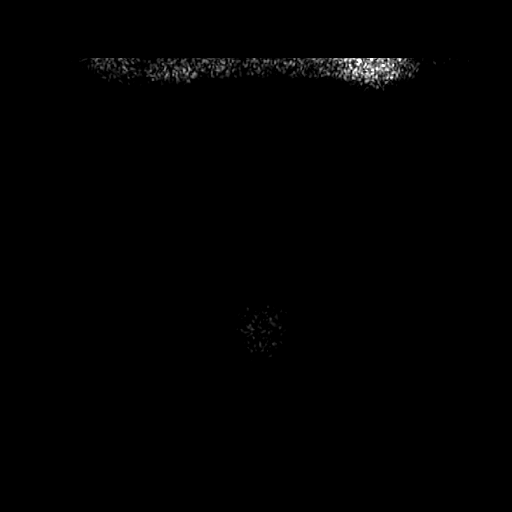
[im 42/42]
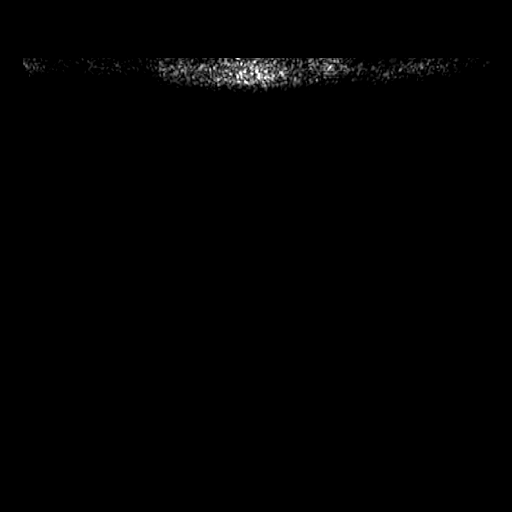

[Series 901: SWI · axial · 2.0mm · 0.47mm/px · z∈[-55,+86]mm · 4 of 72 slices shown]
[im 1/72]
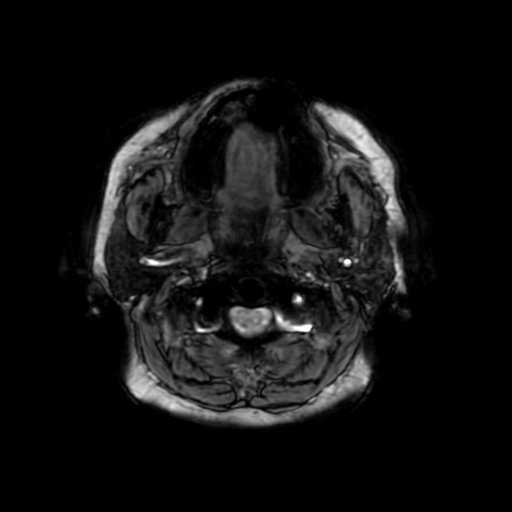
[im 24/72]
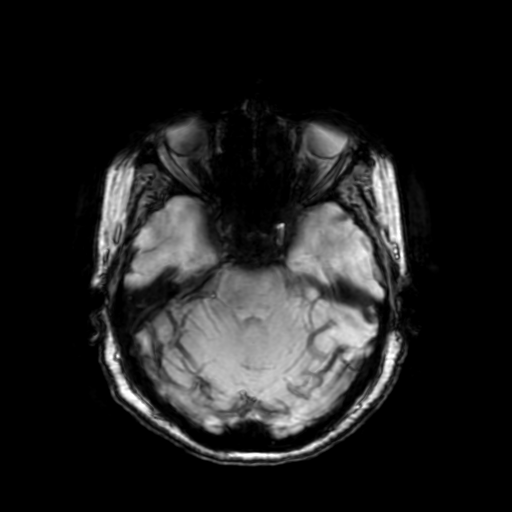
[im 48/72]
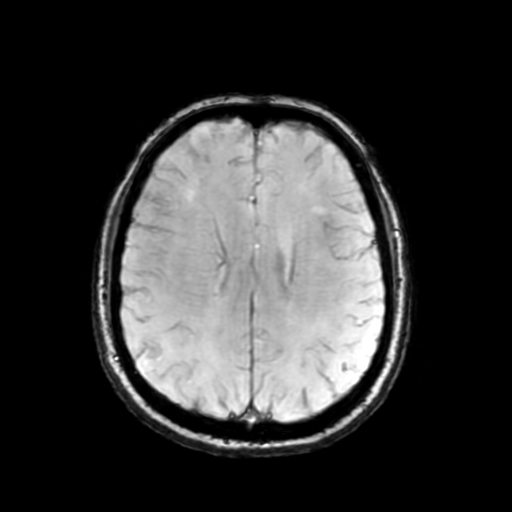
[im 72/72]
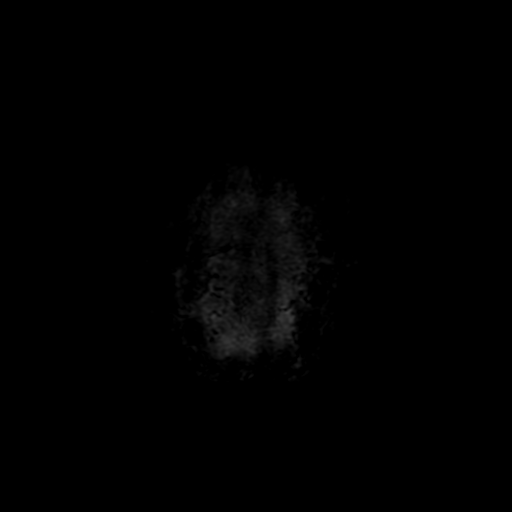

[18 of 48 positions shown; findings below may reference images not displayed]

Indicação clínica: Em investigação neurológica complementar.
Técnica: Sequências multiplanares ponderadas em T1, T2, FLAIR, difusão, SWI.
Análise:
Não foram demonstradas lesões expansivas ou áreas de hemorragia intracraniana.
Leve alargamento dos sulcos e das cisternas encefálicas.
O sistema ventricular tem forma e dimensões preservadas.
Focos de hipersinal em T2 e FLAIR na substância branca dos hemisférios cerebrais, sem efeito expansivo ou 
restrição à difusão.
RESSONÂNCIA MAGNÉTICA DO ENCÉFALO 
O restante do parênquima encefálico tem forma e sinal preservados.
Não foram caracterizados focos isquêmicos recentes.
Sinal de fluxo habitual das grandes artérias dos sistemas vertebrobasilar e carotídeo, pela análise das 
sequências convencionais.
Opinião:
Alteração volumétrica do encéfalo, que pode ser considerada normal para a faixa etária.
Focos de alteração de sinal na substância branca dos hemisférios cerebrais, que devem resultar de 
microangiopatia / gliose em grau leve.
  Se  você  não  for  destinatário,  saiba  que  qualquer  divulgação,  cópia,  distribuição  ou  utilização  do  conteúdo  dessas 
informações é proibido e passível de punição dentro da lei.

## 2022-06-17 NOTE — Progress Notes (Signed)
 Subjective:   Patient ID: Margaret [name] is a 62 y.o. female.    Chief Complaint   Patient presents with   . [email or web address]         HPI   Here for Es[employer] pre employment physical           Review of Systems    Social History     Tobacco Use   Smoking Status Never   Smokeless Tobacco Never     Past Medical History:   Diagnosis Date   . Anxiety    . Asthma      Past Surgical History:   Procedure Laterality Date   . HYSTERECTOMY       No family history on file.  Objective:          Physical Exam       Assessment/Plan:    HPI provided by Self    Based on today's visit:history, physical exam and all relevant testing completed in clinic today  patient's visit diagnosis is/includes   1. Physical exam, pre-employment      Patient does not have a history of chronic conditions.    Treatment plan includes:   Orders Placed:  Orders Placed This Encounter   Procedures   . POCT urinalysis dipstick     Medications ordered this visit      No prescriptions requested or ordered in this encounter       Current medication list and any new medications prescribed or recommended today were reviewed with the patient and specific instructions were provided Yes    Provider Recommendations       Follow up care instructions were provided and reviewed?with the  Patient. All questions were answered. Patient verbalized understanding of plan of care today.      Nicola pre employment physical completed and documented in [email or web address], please see scanned documents. History and physical exam findings reviewed with patient.  Recommendations for work clearance discussed in accordance with [facility] guidelines.

## 2024-03-16 ENCOUNTER — Ambulatory Visit
Admit: 2024-03-16 | Discharge: 2024-03-16 | Payer: PRIVATE HEALTH INSURANCE | Attending: Clinical Cardiac Electrophysiology

## 2024-03-16 DIAGNOSIS — R9431 Abnormal electrocardiogram [ECG] [EKG]: Secondary | ICD-10-CM
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
## 2024-04-01 NOTE — Telephone Encounter (Signed)
 Dru calling from [facility] dept stating she has left 3 messages to patient for MRI scheduling, no responses. Took off scheduling, if they need another one they will have to complete a new order.    Auth expires Feb 2026     Thank you
